# Patient Record
Sex: Male | Born: 1970 | Race: Black or African American | Hispanic: No | Smoking: Current every day smoker
Health system: Southern US, Community
[De-identification: ages and names within clinical notes are randomized; demographics above are authoritative.]

## PROBLEM LIST (undated history)

## (undated) DIAGNOSIS — I10 Essential (primary) hypertension: Secondary | ICD-10-CM

## (undated) DIAGNOSIS — R569 Unspecified convulsions: Secondary | ICD-10-CM

## (undated) DIAGNOSIS — F1010 Alcohol abuse, uncomplicated: Secondary | ICD-10-CM

## (undated) DIAGNOSIS — M109 Gout, unspecified: Secondary | ICD-10-CM

## (undated) HISTORY — PX: HERNIA REPAIR: SHX51

---

## 2017-01-07 ENCOUNTER — Encounter (HOSPITAL_COMMUNITY): Payer: Self-pay

## 2017-01-07 ENCOUNTER — Emergency Department (HOSPITAL_COMMUNITY)
Admission: EM | Admit: 2017-01-07 | Discharge: 2017-01-07 | Discharge status: Against Medical Advice | Payer: Medicaid Other | Attending: Emergency Medicine | Admitting: Emergency Medicine

## 2017-01-07 DIAGNOSIS — R202 Paresthesia of skin: Secondary | ICD-10-CM | POA: Diagnosis present

## 2017-01-07 DIAGNOSIS — Z5321 Procedure and treatment not carried out due to patient leaving prior to being seen by health care provider: Secondary | ICD-10-CM | POA: Insufficient documentation

## 2017-01-07 HISTORY — DX: Unspecified convulsions: R56.9

## 2017-01-07 HISTORY — DX: Gout, unspecified: M10.9

## 2017-01-07 HISTORY — DX: Essential (primary) hypertension: I10

## 2017-01-07 NOTE — ED Triage Notes (Signed)
Pt reports tingling/ numbness that feels like pins and needles in bilateral lower extremities x 2 weeks. Pt states the pain has gotten to the point he is unable to ambulate without severe pain. No neuro deficit.

## 2017-01-07 NOTE — ED Notes (Signed)
Called for vitals recheck, no answer.  

## 2017-04-23 ENCOUNTER — Encounter (HOSPITAL_COMMUNITY): Payer: Self-pay | Admitting: Emergency Medicine

## 2017-04-23 ENCOUNTER — Other Ambulatory Visit: Payer: Self-pay

## 2017-04-23 ENCOUNTER — Emergency Department (HOSPITAL_COMMUNITY): Payer: Medicaid Other

## 2017-04-23 DIAGNOSIS — E872 Acidosis: Secondary | ICD-10-CM | POA: Diagnosis not present

## 2017-04-23 DIAGNOSIS — G621 Alcoholic polyneuropathy: Secondary | ICD-10-CM | POA: Insufficient documentation

## 2017-04-23 DIAGNOSIS — R200 Anesthesia of skin: Secondary | ICD-10-CM | POA: Diagnosis present

## 2017-04-23 DIAGNOSIS — F17200 Nicotine dependence, unspecified, uncomplicated: Secondary | ICD-10-CM | POA: Insufficient documentation

## 2017-04-23 DIAGNOSIS — Z79899 Other long term (current) drug therapy: Secondary | ICD-10-CM | POA: Diagnosis not present

## 2017-04-23 DIAGNOSIS — I10 Essential (primary) hypertension: Secondary | ICD-10-CM | POA: Insufficient documentation

## 2017-04-23 LAB — BASIC METABOLIC PANEL
ANION GAP: 21 — AB (ref 5–15)
BUN: 8 mg/dL (ref 6–20)
CO2: 17 mmol/L — ABNORMAL LOW (ref 22–32)
Calcium: 9.1 mg/dL (ref 8.9–10.3)
Chloride: 100 mmol/L — ABNORMAL LOW (ref 101–111)
Creatinine, Ser: 0.75 mg/dL (ref 0.61–1.24)
Glucose, Bld: 82 mg/dL (ref 65–99)
POTASSIUM: 3.4 mmol/L — AB (ref 3.5–5.1)
SODIUM: 138 mmol/L (ref 135–145)

## 2017-04-23 LAB — CBC
HEMATOCRIT: 36.5 % — AB (ref 39.0–52.0)
HEMOGLOBIN: 12.3 g/dL — AB (ref 13.0–17.0)
MCH: 32.5 pg (ref 26.0–34.0)
MCHC: 33.7 g/dL (ref 30.0–36.0)
MCV: 96.3 fL (ref 78.0–100.0)
Platelets: 238 10*3/uL (ref 150–400)
RBC: 3.79 MIL/uL — AB (ref 4.22–5.81)
RDW: 15.1 % (ref 11.5–15.5)
WBC: 8.3 10*3/uL (ref 4.0–10.5)

## 2017-04-23 LAB — I-STAT TROPONIN, ED: Troponin i, poc: 0 ng/mL (ref 0.00–0.08)

## 2017-04-23 NOTE — ED Triage Notes (Signed)
Pt states that he has numbness in both of his legs from his toes up to his knees.  (reports that has been a chronic issue that is not going away) states that he has sharp chest pain 7/10 and a headache that started this morning. Reports that he recently started drinking again as well.

## 2017-04-24 ENCOUNTER — Emergency Department (HOSPITAL_COMMUNITY)
Admission: EM | Admit: 2017-04-24 | Discharge: 2017-04-24 | Disposition: A | Discharge status: Home / Self Care | Payer: Medicaid Other | Attending: Emergency Medicine | Admitting: Emergency Medicine

## 2017-04-24 ENCOUNTER — Other Ambulatory Visit: Payer: Self-pay

## 2017-04-24 DIAGNOSIS — G621 Alcoholic polyneuropathy: Secondary | ICD-10-CM

## 2017-04-24 DIAGNOSIS — E8729 Other acidosis: Secondary | ICD-10-CM

## 2017-04-24 DIAGNOSIS — E872 Acidosis: Secondary | ICD-10-CM

## 2017-04-24 LAB — HEPATIC FUNCTION PANEL
ALBUMIN: 3.5 g/dL (ref 3.5–5.0)
ALT: 45 U/L (ref 17–63)
AST: 129 U/L — ABNORMAL HIGH (ref 15–41)
Alkaline Phosphatase: 88 U/L (ref 38–126)
BILIRUBIN TOTAL: 1.3 mg/dL — AB (ref 0.3–1.2)
Bilirubin, Direct: 0.3 mg/dL (ref 0.1–0.5)
Indirect Bilirubin: 1 mg/dL — ABNORMAL HIGH (ref 0.3–0.9)
Total Protein: 6.1 g/dL — ABNORMAL LOW (ref 6.5–8.1)

## 2017-04-24 LAB — AMMONIA: Ammonia: 47 umol/L — ABNORMAL HIGH (ref 9–35)

## 2017-04-24 LAB — LIPASE, BLOOD: LIPASE: 31 U/L (ref 11–51)

## 2017-04-24 LAB — MAGNESIUM: Magnesium: 1.3 mg/dL — ABNORMAL LOW (ref 1.7–2.4)

## 2017-04-24 MED ORDER — THIAMINE HCL 100 MG/ML IJ SOLN
100.0000 mg | Freq: Once | INTRAMUSCULAR | Status: AC
  Administered 2017-04-24: 100 mg via INTRAVENOUS
  Filled 2017-04-24: qty 2

## 2017-04-24 MED ORDER — ONDANSETRON HCL 4 MG/2ML IJ SOLN
4.0000 mg | Freq: Once | INTRAMUSCULAR | Status: AC
  Administered 2017-04-24: 4 mg via INTRAVENOUS
  Filled 2017-04-24: qty 2

## 2017-04-24 MED ORDER — MAGNESIUM SULFATE 2 GM/50ML IV SOLN
2.0000 g | Freq: Once | INTRAVENOUS | Status: AC
  Administered 2017-04-24: 2 g via INTRAVENOUS
  Filled 2017-04-24: qty 50

## 2017-04-24 MED ORDER — LORAZEPAM 2 MG/ML IJ SOLN
0.5000 mg | Freq: Once | INTRAMUSCULAR | Status: AC
  Administered 2017-04-24: 0.5 mg via INTRAVENOUS
  Filled 2017-04-24: qty 1

## 2017-04-24 MED ORDER — SODIUM CHLORIDE 0.9 % IV BOLUS (SEPSIS)
1000.0000 mL | Freq: Once | INTRAVENOUS | Status: AC
  Administered 2017-04-24: 1000 mL via INTRAVENOUS

## 2017-04-24 MED ORDER — POTASSIUM CHLORIDE CRYS ER 20 MEQ PO TBCR
40.0000 meq | EXTENDED_RELEASE_TABLET | Freq: Once | ORAL | Status: AC
  Administered 2017-04-24: 40 meq via ORAL
  Filled 2017-04-24: qty 2

## 2017-04-24 MED ORDER — VITAMIN B-1 100 MG PO TABS: 100.0000 mg | ORAL_TABLET | Freq: Once | ORAL | Status: DC

## 2017-04-24 MED ORDER — MAGNESIUM 30 MG PO TABS: 30.0000 mg | ORAL_TABLET | Freq: Two times a day (BID) | ORAL | 0 refills | Status: AC

## 2017-04-24 MED ORDER — FOLIC ACID 1 MG PO TABS
1.0000 mg | ORAL_TABLET | Freq: Once | ORAL | Status: AC
Start: 2017-04-24 — End: 2017-04-24
  Administered 2017-04-24: 1 mg via ORAL
  Filled 2017-04-24: qty 1

## 2017-04-24 NOTE — Discharge Instructions (Signed)
Take multivitamin daily. Stop drinking alcohol. Take magnesium until all gone. Follow up with family doctor for your neuropathy.

## 2017-04-24 NOTE — ED Provider Notes (Signed)
[facility] Provider Note   CSN: [id number] Arrival date & time: 04/23/17  1743     History   Chief Complaint Chief Complaint  Patient presents with  . Numbness  . Chest Pain    HPI Stephen Summers is a 47 y.o. male.  HPI Stephen Summers is a 47 y.o. male with hx of gout, htn, seizures, presents to ED with complaint of bilateral foot numbness up to the knee, abdominal pain, nausea, vomiting, diarrhea, weakness.  Patient states that he has not been able to eat in 3 days.  He states he feels dizzy and dehydrated.  He states he has been seen for the same.,  Patient was just seen for similar complaints 1 week ago and was prescribed Neurontin for peripheral neuropathy.  Patient tells me he quit drinking alcohol 2 months ago, however the note from his family doctor states that he is currently drinking and his last drink was yesterday.  Patient reports generalized abdominal pain, worse in epigastric area that radiates into the chest.  Denies any cough, congestion, chest pain or back pain at this time.  Denies any urinary symptoms.  Denies any blood in his stool or emesis.  Denies any black tarry stools.  Past Medical History:  Diagnosis Date  . Gout   . Hypertension   . Seizures (HCC)     There are no active problems to display for this patient.   Past Surgical History:  Procedure Laterality Date  . HERNIA REPAIR         Home Medications    Prior to Admission medications   Medication Sig Start Date End Date Taking? Authorizing Provider  gabapentin (NEURONTIN) 100 MG capsule Take 100 mg by mouth 3 (three) times daily. 04/16/17  Yes [provider]  potassium chloride SA (K-DUR,KLOR-CON) 20 MEQ tablet Take 20 mEq by mouth daily. 03/29/17  Yes [provider]  ULORIC 40 MG tablet Take 40 mg by mouth daily. 04/14/17  Yes [provider]  Vitamin D, Ergocalciferol, (DRISDOL) 50000 units CAPS capsule Take by mouth once a week.  12/30/16  Yes [provider]    Family History No family history on file.  Social History Social History   Tobacco Use  . Smoking status: Current Every Day Smoker  . Smokeless tobacco: Never Used  Substance Use Topics  . Alcohol use: Yes  . Drug use: No     Allergies   Acetaminophen   Review of Systems Review of Systems  Constitutional: Negative for chills and fever.  Respiratory: Negative for cough, chest tightness and shortness of breath.   Cardiovascular: Negative for chest pain, palpitations and leg swelling.  Gastrointestinal: Positive for abdominal pain, nausea and vomiting. Negative for abdominal distention and diarrhea.  Genitourinary: Negative for dysuria, frequency, hematuria and urgency.  Musculoskeletal: Negative for arthralgias, myalgias, neck pain and neck stiffness.  Skin: Negative for rash.  Allergic/Immunologic: Negative for immunocompromised state.  Neurological: Positive for dizziness, light-headedness and numbness. Negative for weakness and headaches.  All other systems reviewed and are negative.    Physical Exam Updated Vital Signs BP 123/89   Pulse (!) 111   Temp 98.3 F (36.8 C) (Oral)   Resp 18   Ht 6\' 2"  (1.88 m)   Wt 92.5 kg (204 lb)   SpO2 98%   BMI 26.19 kg/m   Physical Exam  Constitutional: He appears well-developed and well-nourished. No distress.  HENT:  Head: Normocephalic and atraumatic.  Eyes: Conjunctivae  are normal.  Neck: Neck supple.  Cardiovascular: Normal rate, regular rhythm and normal heart sounds.  Pulmonary/Chest: Effort normal. No respiratory distress. He has no wheezes. He has no rales.  Abdominal: Soft. Bowel sounds are normal. He exhibits no distension. There is tenderness. There is no rebound.  Epigastric tenderness  Musculoskeletal: He exhibits no edema.  Fungal infection to toenails and toes  Neurological: He is alert.  Tremor noted in upper extremities  Skin: Skin is warm and dry.  Nursing  note and vitals reviewed.    ED Treatments / Results  Labs (all labs ordered are listed, but only abnormal results are displayed) Labs Reviewed  BASIC METABOLIC PANEL - Abnormal; Notable for the following components:      Result Value   Potassium 3.4 (*)    Chloride 100 (*)    CO2 17 (*)    Anion gap 21 (*)    All other components within normal limits  CBC - Abnormal; Notable for the following components:   RBC 3.79 (*)    Hemoglobin 12.3 (*)    HCT 36.5 (*)    All other components within normal limits  HEPATIC FUNCTION PANEL - Abnormal; Notable for the following components:   Total Protein 6.1 (*)    AST 129 (*)    Total Bilirubin 1.3 (*)    Indirect Bilirubin 1.0 (*)    All other components within normal limits  AMMONIA - Abnormal; Notable for the following components:   Ammonia 47 (*)    All other components within normal limits  MAGNESIUM - Abnormal; Notable for the following components:   Magnesium 1.3 (*)    All other components within normal limits  LIPASE, BLOOD  I-STAT TROPONIN, ED    EKG  EKG Interpretation  Date/Time:  Wednesday April 23 2017 18:10:51 EST Ventricular Rate:  119 PR Interval:  168 QRS Duration: 82 QT Interval:  314 QTC Calculation: 441 R Axis:   81 Text Interpretation:  Sinus tachycardia Possible Left atrial enlargement Cannot rule out Anterior infarct , age undetermined Abnormal ECG Confirmed by Tilden Fossa [phone]) on 04/24/2017 7:17:12 AM       Radiology Dg Chest 2 View  Result Date: 04/23/2017 CLINICAL DATA:  Numbness in both legs from toes knees, headache, sharp chest pain EXAM: CHEST  2 VIEW COMPARISON:  None FINDINGS: Normal heart size, mediastinal contours, and pulmonary vascularity. Mild peribronchial thickening. No pulmonary infiltrate, pleural effusion, or pneumothorax. Bones unremarkable. IMPRESSION: Mild bronchitic changes without infiltrate. Electronically Signed   By: Ulyses Southward M.D.   On: 04/23/2017 19:09     Procedures Procedures (including critical care time)  Medications Ordered in ED Medications  sodium chloride 0.9 % bolus 1,000 mL (not administered)  ondansetron (ZOFRAN) injection 4 mg (not administered)  LORazepam (ATIVAN) injection 0.5 mg (not administered)     Initial Impression / Assessment and Plan / ED Course  I have reviewed the triage vital signs and the nursing notes.  Pertinent labs & imaging results that were available during my care of the patient were reviewed by me and considered in my medical decision making (see chart for details).     Pt seen and examined. Pt in ED with numbness to bilateral legs, it does not seem to be a new findings. Pt was just seen for the same, prescribed neurontin. Pt told me he does not drink alcohol, but recent note from a week ago from PCP states that pt is an active drinker.  She has  been in the waiting room for over 12 hours, on exam he is tremulous.  I will given Ativan cardiac, will give IV fluids.  Bicarb here 17, anion gap is 2100.  Suspect alcoholic acidosis.  Will hydrate, check LFTs, check ammonia level, monitor.   10:22 AM Patient reassessed, he is feeling better, his heart rate improved.  His heart rate is now in the 80s.  Blood pressure improved.  Magnesium is 1.3, will replace with IV magnesium and p.o. potassium.  Discussed results with patient.  Plan to medicate, ambulate, still pending UA.  Patient ambulated with no problems.  Vital signs continued to stabilize.  Plan to discharge home with some magnesium.  Follow-up with family doctor.  Continue Neurontin for peripheral neuropathy.   Vitals:   04/24/17 1045 04/24/17 1100 04/24/17 1115 04/24/17 1130  BP: (!) 166/107 (!) 151/108 (!) 144/98 (!) 156/103  Pulse: 65 86 81 79  Resp: (!) 9 16 12 11   Temp:      TempSrc:      SpO2: 99% 94% 97% 100%  Weight:      Height:         Final Clinical Impressions(s) / ED Diagnoses   Final diagnoses:  Alcoholic peripheral  neuropathy (HCC)  Alcoholic ketoacidosis  Hypomagnesemia    ED Discharge Orders        Ordered    magnesium 30 MG tablet  2 times daily     04/24/17 [address] [place], PA-C 04/24/17 1613    Lorre Nick, MD 04/25/17 1116

## 2017-08-24 ENCOUNTER — Emergency Department (HOSPITAL_COMMUNITY)
Admission: EM | Admit: 2017-08-24 | Discharge: 2017-08-26 | Disposition: A | Discharge status: Home / Self Care | Payer: Medicaid Other | Attending: Emergency Medicine | Admitting: Emergency Medicine

## 2017-08-24 ENCOUNTER — Encounter (HOSPITAL_COMMUNITY): Payer: Self-pay | Admitting: Emergency Medicine

## 2017-08-24 DIAGNOSIS — M7989 Other specified soft tissue disorders: Secondary | ICD-10-CM

## 2017-08-24 DIAGNOSIS — F332 Major depressive disorder, recurrent severe without psychotic features: Secondary | ICD-10-CM | POA: Diagnosis not present

## 2017-08-24 DIAGNOSIS — F1020 Alcohol dependence, uncomplicated: Secondary | ICD-10-CM

## 2017-08-24 DIAGNOSIS — R000 Tachycardia, unspecified: Secondary | ICD-10-CM | POA: Diagnosis not present

## 2017-08-24 DIAGNOSIS — Z79899 Other long term (current) drug therapy: Secondary | ICD-10-CM | POA: Insufficient documentation

## 2017-08-24 DIAGNOSIS — F17200 Nicotine dependence, unspecified, uncomplicated: Secondary | ICD-10-CM | POA: Diagnosis not present

## 2017-08-24 DIAGNOSIS — R2240 Localized swelling, mass and lump, unspecified lower limb: Secondary | ICD-10-CM | POA: Diagnosis present

## 2017-08-24 DIAGNOSIS — I10 Essential (primary) hypertension: Secondary | ICD-10-CM | POA: Insufficient documentation

## 2017-08-24 DIAGNOSIS — F10120 Alcohol abuse with intoxication, uncomplicated: Secondary | ICD-10-CM | POA: Insufficient documentation

## 2017-08-24 DIAGNOSIS — F10920 Alcohol use, unspecified with intoxication, uncomplicated: Secondary | ICD-10-CM

## 2017-08-24 LAB — COMPREHENSIVE METABOLIC PANEL
ALK PHOS: 69 U/L (ref 38–126)
ALT: 40 U/L (ref 17–63)
ANION GAP: 21 — AB (ref 5–15)
AST: 93 U/L — ABNORMAL HIGH (ref 15–41)
Albumin: 4.2 g/dL (ref 3.5–5.0)
BUN: 12 mg/dL (ref 6–20)
CALCIUM: 9 mg/dL (ref 8.9–10.3)
CO2: 18 mmol/L — ABNORMAL LOW (ref 22–32)
CREATININE: 0.73 mg/dL (ref 0.61–1.24)
Chloride: 97 mmol/L — ABNORMAL LOW (ref 101–111)
Glucose, Bld: 67 mg/dL (ref 65–99)
Potassium: 4.4 mmol/L (ref 3.5–5.1)
Sodium: 136 mmol/L (ref 135–145)
Total Bilirubin: 0.9 mg/dL (ref 0.3–1.2)
Total Protein: 8 g/dL (ref 6.5–8.1)

## 2017-08-24 LAB — RAPID URINE DRUG SCREEN, HOSP PERFORMED
Amphetamines: NOT DETECTED
BARBITURATES: NOT DETECTED
BENZODIAZEPINES: NOT DETECTED
Cocaine: NOT DETECTED
Opiates: NOT DETECTED
Tetrahydrocannabinol: NOT DETECTED

## 2017-08-24 LAB — ETHANOL: Alcohol, Ethyl (B): 316 mg/dL (ref <10)

## 2017-08-24 LAB — CBC
HEMATOCRIT: 36.2 % — AB (ref 39.0–52.0)
Hemoglobin: 11.7 g/dL — ABNORMAL LOW (ref 13.0–17.0)
MCH: 29.5 pg (ref 26.0–34.0)
MCHC: 32.3 g/dL (ref 30.0–36.0)
MCV: 91.4 fL (ref 78.0–100.0)
PLATELETS: 352 10*3/uL (ref 150–400)
RBC: 3.96 MIL/uL — ABNORMAL LOW (ref 4.22–5.81)
RDW: 12.8 % (ref 11.5–15.5)
WBC: 6.9 10*3/uL (ref 4.0–10.5)

## 2017-08-24 MED ORDER — SODIUM CHLORIDE 0.9 % IV BOLUS
1000.0000 mL | Freq: Once | INTRAVENOUS | Status: AC
  Administered 2017-08-24: 1000 mL via INTRAVENOUS

## 2017-08-24 MED ORDER — FOLIC ACID 1 MG PO TABS
1.0000 mg | ORAL_TABLET | Freq: Once | ORAL | Status: AC
  Administered 2017-08-24: 1 mg via ORAL
  Filled 2017-08-24: qty 1

## 2017-08-24 MED ORDER — VITAMIN B-1 100 MG PO TABS
100.0000 mg | ORAL_TABLET | Freq: Once | ORAL | Status: AC
  Administered 2017-08-24: 100 mg via ORAL
  Filled 2017-08-24: qty 1

## 2017-08-24 MED ORDER — CHLORDIAZEPOXIDE HCL 25 MG PO CAPS: ORAL_CAPSULE | ORAL | 0 refills | Status: DC

## 2017-08-24 NOTE — ED Triage Notes (Signed)
Pt presents to ED for "a while" of bilateral lower limb swelling, worse when walking on them.  Pt also states he would like to detox from alcohol.  Last drink a few hours ago.  Patient alreqdy shady, hypertensive.

## 2017-08-24 NOTE — ED Provider Notes (Signed)
[facility] EMERGENCY DEPARTMENT Provider Note   CSN: [id number] Arrival date & time: 08/24/17  2001     History   Chief Complaint Chief Complaint  Patient presents with  . Leg Swelling  . Alcohol Problem    HPI Stephen Summers is a 47 y.o. male.  HPI Patient presents with concern of bilateral lower extremity swelling, pain with ambulation. Patient notes a history of hypertension, for which she has not taken his medication in about 2 weeks, as well as alcohol abuse. He notes that he has been drinking substantially, particularly the past 4 days, and within the past few hours. He drinks either an innumerable amount of beer or 2 pints of hard alcohol on a daily basis, he estimates. He acknowledges pain essentially everywhere, but seemingly focuses on pain in both lower extremities, with swelling in these areas. Onset is unclear, and symptoms are again, worse with ambulation. No loss of sensation in the distal extremities. Patient acknowledges some dysphoria, but denies suicidal ideation, homicidal ideation. Patient has children in the area, but was recently kicked out of his girlfriend's house, and is transient.   Past Medical History:  Diagnosis Date  . Gout   . Hypertension   . Seizures (HCC)     There are no active problems to display for this patient.   Past Surgical History:  Procedure Laterality Date  . HERNIA REPAIR          Home Medications    Prior to Admission medications   Medication Sig Start Date End Date Taking? Authorizing Provider  chlordiazePOXIDE (LIBRIUM) 25 MG capsule 50mg  PO TID x 1D, then 25-50mg  PO BID X 1D, then 25-50mg  PO QD X 1D 08/24/17   Gerhard Munch, MD  gabapentin (NEURONTIN) 100 MG capsule Take 100 mg by mouth 3 (three) times daily. 04/16/17   [provider]  magnesium 30 MG tablet Take 1 tablet (30 mg total) by mouth 2 (two) times daily. 04/24/17   Kirichenko, Tatyana, PA-C  potassium chloride SA  (K-DUR,KLOR-CON) 20 MEQ tablet Take 20 mEq by mouth daily. 03/29/17   [provider]  ULORIC 40 MG tablet Take 40 mg by mouth daily. 04/14/17   [provider]  Vitamin D, Ergocalciferol, (DRISDOL) 50000 units CAPS capsule Take by mouth once a week. 12/30/16   [provider]    Family History History reviewed. No pertinent family history.  Social History Social History   Tobacco Use  . Smoking status: Current Every Day Smoker  . Smokeless tobacco: Never Used  Substance Use Topics  . Alcohol use: Yes  . Drug use: No     Allergies   Acetaminophen   Review of Systems Review of Systems  Constitutional:       Per HPI, otherwise negative  HENT:       Per HPI, otherwise negative  Respiratory:       Per HPI, otherwise negative  Cardiovascular:       Per HPI, otherwise negative  Gastrointestinal: Negative for vomiting.  Endocrine:       Negative aside from HPI  Genitourinary:       Neg aside from HPI   Musculoskeletal:       Per HPI, otherwise negative  Skin: Negative.   Neurological: Negative for syncope.     Physical Exam Updated Vital Signs BP (!) 159/88   Pulse (!) 109   Temp 97.9 F (36.6 C) (Oral)   Resp 12   SpO2 100%  Physical Exam  Constitutional: He is oriented to person, place, and time. He appears well-developed.  Disheveled male awake and alert  HENT:  Head: Normocephalic and atraumatic.  Eyes: Conjunctivae and EOM are normal.  Cardiovascular: Regular rhythm.  Tachycardic  Pulmonary/Chest: Effort normal. No stridor. No respiratory distress.  Abdominal: He exhibits no distension.  Musculoskeletal: He exhibits edema.  Bilateral lower extremity edema, symmetric, nonpitting  Neurological: He is alert and oriented to person, place, and time. He displays atrophy.  Skin: Skin is warm and dry.  Psychiatric: His mood appears anxious. He exhibits a depressed mood.  Nursing note and vitals reviewed.    ED Treatments /  Results  Labs (all labs ordered are listed, but only abnormal results are displayed) Labs Reviewed  COMPREHENSIVE METABOLIC PANEL - Abnormal; Notable for the following components:      Result Value   Chloride 97 (*)    CO2 18 (*)    AST 93 (*)    Anion gap 21 (*)    All other components within normal limits  ETHANOL - Abnormal; Notable for the following components:   Alcohol, Ethyl (B) 316 (*)    All other components within normal limits  CBC - Abnormal; Notable for the following components:   RBC 3.96 (*)    Hemoglobin 11.7 (*)    HCT 36.2 (*)    All other components within normal limits  RAPID URINE DRUG SCREEN, HOSP PERFORMED    EKG None  Radiology No results found.  Procedures Procedures (including critical care time)  Medications Ordered in ED Medications  sodium chloride 0.9 % bolus 1,000 mL (has no administration in time range)  sodium chloride 0.9 % bolus 1,000 mL (0 mLs Intravenous Stopped 08/24/17 2319)  thiamine (VITAMIN B-1) tablet 100 mg (100 mg Oral Given 08/24/17 2139)  folic acid (FOLVITE) tablet 1 mg (1 mg Oral Given 08/24/17 2202)     Initial Impression / Assessment and Plan / ED Course  I have reviewed the triage vital signs and the nursing notes.  Pertinent labs & imaging results that were available during my care of the patient were reviewed by me and considered in my medical decision making (see chart for details).     11:19 PM Patient remains intoxicated, tachycardic, but more appropriately oriented. With substantial alcohol intoxication, he will require additional time for sobriety. Patient is also receiving additional fluids. Patient has no evidence for complicated withdrawal, no evidence for concurrent physiologic disease, including coronary ischemia, or other pathology. When the patient is awake, sober, he can be discharged, and will be provided resources for outpatient counseling/substance abuse center, as well as Chlordiapoxide taper  Final  Clinical Impressions(s) / ED Diagnoses   Final diagnoses:  Alcoholic intoxication without complication (HCC)  Tachycardia    ED Discharge Orders        Ordered    chlordiazePOXIDE (LIBRIUM) 25 MG capsule     08/24/17 2319       Gerhard MunchLockwood, Kemberly Taves, MD 08/24/17 2322

## 2017-08-24 NOTE — ED Notes (Signed)
Pt unable to void at this time. 

## 2017-08-24 NOTE — Discharge Instructions (Addendum)
Please be sure to proceed directly to a rehabilitation center.   Take librium as prescribed.   Take hctz as prescribed.   Increase metoprolol 50 mg daily.   Return to ER if you have chest pain, trouble breathing, tremors, seizures.

## 2017-08-25 ENCOUNTER — Other Ambulatory Visit: Payer: Self-pay

## 2017-08-25 DIAGNOSIS — F17200 Nicotine dependence, unspecified, uncomplicated: Secondary | ICD-10-CM

## 2017-08-25 DIAGNOSIS — Z653 Problems related to other legal circumstances: Secondary | ICD-10-CM

## 2017-08-25 DIAGNOSIS — R454 Irritability and anger: Secondary | ICD-10-CM | POA: Diagnosis not present

## 2017-08-25 DIAGNOSIS — F332 Major depressive disorder, recurrent severe without psychotic features: Secondary | ICD-10-CM

## 2017-08-25 DIAGNOSIS — Y908 Blood alcohol level of 240 mg/100 ml or more: Secondary | ICD-10-CM | POA: Diagnosis not present

## 2017-08-25 DIAGNOSIS — F10229 Alcohol dependence with intoxication, unspecified: Secondary | ICD-10-CM

## 2017-08-25 DIAGNOSIS — R200 Anesthesia of skin: Secondary | ICD-10-CM

## 2017-08-25 DIAGNOSIS — F419 Anxiety disorder, unspecified: Secondary | ICD-10-CM

## 2017-08-25 DIAGNOSIS — R450 Nervousness: Secondary | ICD-10-CM | POA: Diagnosis not present

## 2017-08-25 DIAGNOSIS — F10920 Alcohol use, unspecified with intoxication, uncomplicated: Secondary | ICD-10-CM | POA: Insufficient documentation

## 2017-08-25 DIAGNOSIS — Z9114 Patient's other noncompliance with medication regimen: Secondary | ICD-10-CM

## 2017-08-25 DIAGNOSIS — F1020 Alcohol dependence, uncomplicated: Secondary | ICD-10-CM

## 2017-08-25 MED ORDER — IBUPROFEN 400 MG PO TABS
600.0000 mg | ORAL_TABLET | Freq: Once | ORAL | Status: AC
  Administered 2017-08-25: 600 mg via ORAL
  Filled 2017-08-25: qty 1

## 2017-08-25 MED ORDER — LORAZEPAM 1 MG PO TABS
0.0000 mg | ORAL_TABLET | Freq: Four times a day (QID) | ORAL | Status: DC
  Administered 2017-08-25: 2 mg via ORAL
  Administered 2017-08-25: 1 mg via ORAL
  Administered 2017-08-26: 2 mg via ORAL
  Filled 2017-08-25 (×2): qty 2
  Filled 2017-08-25: qty 1

## 2017-08-25 MED ORDER — LORAZEPAM 1 MG PO TABS: 0.0000 mg | ORAL_TABLET | Freq: Two times a day (BID) | ORAL | Status: DC

## 2017-08-25 MED ORDER — IBUPROFEN 800 MG PO TABS
800.0000 mg | ORAL_TABLET | Freq: Once | ORAL | Status: AC
  Administered 2017-08-25: 800 mg via ORAL
  Filled 2017-08-25: qty 1

## 2017-08-25 MED ORDER — CLONIDINE HCL 0.2 MG PO TABS
0.2000 mg | ORAL_TABLET | Freq: Once | ORAL | Status: AC
  Administered 2017-08-25: 0.2 mg via ORAL
  Filled 2017-08-25: qty 1

## 2017-08-25 MED ORDER — LORAZEPAM 2 MG/ML IJ SOLN
0.0000 mg | Freq: Four times a day (QID) | INTRAMUSCULAR | Status: DC
  Administered 2017-08-25: 2 mg via INTRAVENOUS
  Filled 2017-08-25: qty 1

## 2017-08-25 MED ORDER — LORAZEPAM 2 MG/ML IJ SOLN: 0.0000 mg | Freq: Two times a day (BID) | INTRAMUSCULAR | Status: DC

## 2017-08-25 MED ORDER — METOPROLOL TARTRATE 25 MG PO TABS: 37.5000 mg | ORAL_TABLET | Freq: Every day | ORAL | Status: DC

## 2017-08-25 MED ORDER — VITAMIN B-1 100 MG PO TABS
100.0000 mg | ORAL_TABLET | Freq: Every day | ORAL | Status: DC
  Administered 2017-08-25 – 2017-08-26 (×2): 100 mg via ORAL
  Filled 2017-08-25 (×2): qty 1

## 2017-08-25 MED ORDER — GABAPENTIN 100 MG PO CAPS
100.0000 mg | ORAL_CAPSULE | Freq: Three times a day (TID) | ORAL | Status: DC
  Administered 2017-08-25 – 2017-08-26 (×2): 100 mg via ORAL
  Filled 2017-08-25 (×2): qty 1

## 2017-08-25 MED ORDER — CLONIDINE HCL 0.2 MG PO TABS
0.1000 mg | ORAL_TABLET | Freq: Once | ORAL | Status: AC
  Administered 2017-08-25: 0.1 mg via ORAL
  Filled 2017-08-25: qty 1

## 2017-08-25 MED ORDER — VITAMIN B-1 100 MG PO TABS: 100.0000 mg | ORAL_TABLET | Freq: Every day | ORAL | Status: DC

## 2017-08-25 MED ORDER — THIAMINE HCL 100 MG/ML IJ SOLN: 100.0000 mg | Freq: Every day | INTRAMUSCULAR | Status: DC

## 2017-08-25 MED ORDER — PANTOPRAZOLE SODIUM 40 MG PO TBEC
40.0000 mg | DELAYED_RELEASE_TABLET | Freq: Every day | ORAL | Status: DC
  Administered 2017-08-26: 40 mg via ORAL
  Filled 2017-08-25: qty 1

## 2017-08-25 NOTE — ED Triage Notes (Signed)
Reported to EDP Cook Pt BP. New orders given

## 2017-08-25 NOTE — ED Notes (Signed)
[facility] pt is to see psychiatry this am. Pt sleeping. No distress noted. Resp even and non-labored.  Will continue to monitor.

## 2017-08-25 NOTE — BH Assessment (Addendum)
Tele Assessment Note   Patient Name: Stephen Summers MRN: [id number] Referring Physician: Benjiman Core, MD Location of Patient: [facility], D33C Location of Provider: Behavioral Health TTS Department  Stephen Summers is an 47 y.o. widowed male who presents unaccompanied to [facility] ED requesting treatment for alcohol dependence. Pt reports he has been drinking 2-3 bottles of liquor daily for years. Pt's blood alcohol level is 316. He says he has been unable to stop and his longest period of sobriety is one week. He says he experiences withdrawal symptoms when he tries to stop including tremors, sweats, nausea, vomiting and decreased eating. He acknowledges experiencing blackouts. He says he is on disability due to a seizure disorder. Pt reports his mood recently has been irritable. Pt acknowledges symptoms including crying spells, social withdrawal, loss of interest in usual pleasures, fatigue, irritability, decreased concentration, decreased sleep, decreased appetite and feelings of guilt and hopelessness. He says he had suicidal ideation earlier today with no plan or intent and he denies current suicidal ideation. He denies any history of suicide attempts. He denies any history of intentional self-injurious behaviors. Pt reports he experiences hallucinations of feel like someone is standing beside him. He also reports hearing random noise of bells ringing. He says he sometimes sees "yellow flashes." Pt denies use of substances other than alcohol and Pt's urine drug screen is negative. Pt also reports several somatic complaints including: numbness in shoulder, numbness is feet and toes, severe headache and chest discomfort. He denies homicidal ideation or history of violence.  Pt identifies his somatic complaints and consequences of alcohol use as his primary stressors. He says his girlfriend of five years kicked him out of her residence one week ago due to Pt's drinking. He says he is currently  staying with his adult daughter and he also has three adult sons who live locally. Pt says he has been charged with DWI and has a court date in August. He says he is on disability due to seizure disorder. Pt says his wife died of cancer three years ago. He denies history of abuse or trauma. Pt denies any history of inpatient or outpatient mental health or substance abuse treatment.  Pt is dressed in hospital scrubs, alert, intoxicated and oriented x4. Pt speaks in a clear tone, at moderate volume and normal pace. Motor behavior appears normal. Eye contact is good. Pt's mood is depressed and affect is congruent with mood. Thought process is coherent and relevant. There is no obvious indication Pt is currently responding to internal stimuli or experiencing delusional thought content. Pt was cooperative throughout assessment. He says he is motivated to stop drinking and is willing to sign voluntarily into a treatment facility.   Diagnosis: F10.20 Alcohol Use Disorder, Severe  Past Medical History:  Past Medical History:  Diagnosis Date  . Gout   . Hypertension   . Seizures (HCC)     Past Surgical History:  Procedure Laterality Date  . HERNIA REPAIR      Family History: History reviewed. No pertinent family history.  Social History:  reports that he has been smoking.  He has never used smokeless tobacco. He reports that he drinks alcohol. He reports that he does not use drugs.  Additional Social History:  Alcohol / Drug Use Pain Medications: See MAR Prescriptions: See MAR Over the Counter: See MAR History of alcohol / drug use?: Yes Longest period of sobriety (when/how long): One week Negative Consequences of Use: Financial, Legal, Personal relationships Withdrawal Symptoms: Irritability,  Nausea / Vomiting, Tremors, Sweats, Seizures Onset of Seizures: Late 4620s Date of most recent seizure: Pt doesn't know Substance #1 Name of Substance 1: Alcohol 1 - Age of First Use: 12 1 - Amount  (size/oz): 2-3 fifths of liquor 1 - Frequency: Daily 1 - Duration: Ongoing for years 1 - Last Use / Amount: 08/24/17, Pt cannot estimate how much he drank  CIWA: CIWA-Ar BP: (!) 150/90 Pulse Rate: (!) 103 Nausea and Vomiting: no nausea and no vomiting Tactile Disturbances: none Tremor: moderate, with patient's arms extended Auditory Disturbances: moderate harshness or ability to frighten Paroxysmal Sweats: no sweat visible Visual Disturbances: not present Anxiety: mildly anxious Headache, Fullness in Head: moderately severe Agitation: normal activity Orientation and Clouding of Sensorium: oriented and can do serial additions CIWA-Ar Total: 12 COWS:    Allergies:  Allergies  Allergen Reactions  . Acetaminophen Other (See Comments)    Due to liver.    Home Medications:  (Not in a hospital admission)  OB/GYN Status:  No LMP for male patient.  General Assessment Data [facility] Assessment: In system Is this a Tele or Face-to-Face Assessment?: Tele Assessment Is this an Initial Assessment or a Re-assessment for this encounter?: Initial Assessment Marital status: Widowed BancroftMaiden name: NA Is patient pregnant?: No Pregnancy Status: No Living Arrangements: Other relatives(Staying with daughter) Can pt return to current living arrangement?: Yes Admission Status: Voluntary Is patient capable of signing voluntary admission?: Yes Referral Source: Self/Family/Friend Insurance type: Medicaid     Crisis Care Plan Living Arrangements: Other relatives(Staying with daughter) Legal Guardian: Other:(Self) Name of Psychiatrist: None Name of Therapist: None  Education Status Is patient currently in school?: No Is the patient employed, unemployed or receiving disability?: Receiving disability income  Risk to self with the past 6 months Suicidal Ideation: No Has patient been a risk to self within the past 6 months prior to admission? : Yes Suicidal Intent:  No-Not Currently/Within Last 6 Months Has patient had any suicidal intent within the past 6 months prior to admission? : No Is patient at risk for suicide?: No Suicidal Plan?: No Has patient had any suicidal plan within the past 6 months prior to admission? : No Access to Means: No What has been your use of drugs/alcohol within the last 12 months?: Pt drinking large quantities of alcohol daily Previous Attempts/Gestures: No How many times?: 0 Other Self Harm Risks: None Triggers for Past Attempts: None known Intentional Self Injurious Behavior: None Family Suicide History: No Recent stressful life event(s): Loss (Comment)(Girlfriend broke up with him) Persecutory voices/beliefs?: No Depression: Yes Depression Symptoms: Despondent, Insomnia, Tearfulness, Isolating, Fatigue, Guilt, Loss of interest in usual pleasures, Feeling worthless/self pity, Feeling angry/irritable Substance abuse history and/or treatment for substance abuse?: No Suicide prevention information given to non-admitted patients: Not applicable  Risk to Others within the past 6 months Homicidal Ideation: No Does patient have any lifetime risk of violence toward others beyond the six months prior to admission? : No Thoughts of Harm to Others: No Current Homicidal Intent: No Current Homicidal Plan: No Access to Homicidal Means: No Identified Victim: None History of harm to others?: No Assessment of Violence: None Noted Violent Behavior Description: Pt denies history of violence Does patient have access to weapons?: No Criminal Charges Pending?: Yes Describe Pending Criminal Charges: DWI Does patient have a court date: Yes Court Date: 10/16/17 Is patient on probation?: No  Psychosis Hallucinations: Auditory, Visual Delusions: None noted  Mental Status Report Appearance/Hygiene: In scrubs  Eye Contact: Good Motor Activity: Unremarkable Speech: Logical/coherent Level of Consciousness: Alert, Other  (Comment)(Intoxicated) Mood: Depressed Affect: Appropriate to circumstance Anxiety Level: Minimal Thought Processes: Coherent, Relevant Judgement: Partial Orientation: Person, Place, Time, Situation Obsessive Compulsive Thoughts/Behaviors: None  Cognitive Functioning Concentration: Decreased Memory: Recent Intact, Remote Intact Is patient IDD: No Is patient DD?: No Insight: Fair Impulse Control: Fair Appetite: Poor Have you had any weight changes? : Loss Amount of the weight change? (lbs): 5 lbs Sleep: Decreased Total Hours of Sleep: 4 Vegetative Symptoms: None  ADLScreening [facility] Assessment Services) Patient's cognitive ability adequate to safely complete daily activities?: Yes Patient able to express need for assistance with ADLs?: Yes Independently performs ADLs?: Yes (appropriate for developmental age)  Prior Inpatient Therapy Prior Inpatient Therapy: No  Prior Outpatient Therapy Prior Outpatient Therapy: No Does patient have an ACCT team?: No Does patient have Intensive In-House Services?  : No Does patient have [facility] services? : No Does patient have P4CC services?: No  ADL Screening (condition at time of admission) Patient's cognitive ability adequate to safely complete daily activities?: Yes Is the patient deaf or have difficulty hearing?: No Does the patient have difficulty seeing, even when wearing glasses/contacts?: No Does the patient have difficulty concentrating, remembering, or making decisions?: No Patient able to express need for assistance with ADLs?: Yes Does the patient have difficulty dressing or bathing?: No Independently performs ADLs?: Yes (appropriate for developmental age) Does the patient have difficulty walking or climbing stairs?: No Weakness of Legs: None Weakness of Arms/Hands: None  Home Assistive Devices/Equipment Home Assistive Devices/Equipment: None    Abuse/Neglect Assessment (Assessment to be complete while patient is  alone) Abuse/Neglect Assessment Can Be Completed: Yes Physical Abuse: Denies Verbal Abuse: Denies Sexual Abuse: Denies Exploitation of patient/patient's resources: Denies Self-Neglect: Denies     Merchant navy officer (For Healthcare) Does Patient Have a Medical Advance Directive?: No Would patient like information on creating a medical advance directive?: No - Patient declined          Disposition: Gave clinical report to Donell Sievert, PA who recommended that due to intoxication Pt be observed for safety and stabilization and evaluated by psychiatry later today. Notified Dr. Benjiman Core and Madalyn Rob, RN of recommendation.  Disposition Initial Assessment Completed for this Encounter: Yes Patient referred to: Other (Comment)  This service was provided via telemedicine using a 2-way, interactive audio and video technology.  Names of all persons participating in this telemedicine service and their role in this encounter. Name: Charmian Muff Role: Patient  Name: Shela Commons, [facility] Role: TTS counselor         Harlin Rain Patsy [place], [facility], [facility], [facility] Triage Specialist [phone]  [place], [place] 08/25/2017 3:19 AM

## 2017-08-25 NOTE — Consult Note (Signed)
Telepsych Consultation   Reason for Consult:  Depressive symptoms, ongoing alcohol abuse Referring Physician: EDP Location of [facility]: [facility] Location of Provider: [place] Department  Patient Identification: Stephen Summers MRN:  [id number] Principal Diagnosis: Alcohol dependence ([place]) Diagnosis:   Patient Active Problem List   Diagnosis Date Noted  . MDD (major depressive disorder), recurrent episode, severe ([place]) [F33.2] 08/25/2017  . Alcohol dependence ([place]) [F10.20] 08/25/2017  . Alcoholic intoxication without complication ([place]) [Q67.619]     Total Time spent with patient: 20 minutes  Subjective:   Stephen Summers is a 47 y.o. male patient who stated he was in the ED because "my drinking problem, my leg and chest are numb". Stated he doesn't feel like he has control over his alcohol use and worried for his life as he knows the alcoholism could eventually lead to his death.  HPI:  Stephen Summers is an 47 y.o. widowed male who presented unaccompanied [facility] requesting treatment for alcohol dependence. Patient reported he had drinking about 1/2 gallon of gin every 2 days for the past 5 days without eating. Stated his alcohol use in the past 8-9 months had been worsening whereby he could drink up to 1/2 gallon of gin/day.  Pt reports he has been drinking 2-3 bottles of liquor daily for years. He expresses frustration with his life overall especially the progression of the alcohol abuse.  Pt's blood alcohol level is 316. He says he has been unable to stop and his longest period of sobriety is one week. He says he experiences withdrawal symptoms when he tries to stop including tremors, sweats, nausea, vomiting and decreased eating. He acknowledges experiencing blackouts. He says he is on disability due to a seizure disorder and concerned about the effects of the alcohol abuse on seizures. Pt admits history of depression and stated he was started on an antidepressant "a  couple of months ago" but he self d/ced this for no reason. Pt reports his mood recently has been irritable. Pt acknowledges symptoms including crying spells, social withdrawal, loss of interest in usual pleasures, fatigue, irritability, decreased concentration, decreased sleep, decreased appetite and feelings of guilt and hopelessness. He says he had suicidal ideation  with no plan or intent and he denies current suicidal ideation. He denies any history of suicide attempts. He denies any history of intentional self-injurious behaviors. Pt reported one episode of hallucination last night whereby he felt like someone was standing beside him.Pt denies use of substances other than alcohol with most recent level 316 and Pt's urine drug screen is negative. Pt also reports several somatic complaints including: numbness in shoulder, numbness is feet and toes, severe headache and chest discomfort. He denies homicidal ideation or history of violence  Past Psychiatric History: Depression, Alcohol abuse  Risk to Self: Suicidal Ideation: No Suicidal Intent: No-Not Currently/Within Last 6 Months Is patient at risk for suicide?: No Suicidal Plan?: No Access to Means: No What has been your use of drugs/alcohol within the last 12 months?: Pt drinking large quantities of alcohol daily How many times?: 0 Other Self Harm Risks: None Triggers for Past Attempts: None known Intentional Self Injurious Behavior: None Risk to Others: Homicidal Ideation: No Thoughts of Harm to Others: No Current Homicidal Intent: No Current Homicidal Plan: No Access to Homicidal Means: No Identified Victim: None History of harm to others?: No Assessment of Violence: None Noted Violent Behavior Description: Pt denies history of violence Does patient have access to weapons?: No Criminal  Charges Pending?: Yes Describe Pending Criminal Charges: DWI Does patient have a court date: Yes Court Date: 10/16/17 Prior Inpatient Therapy: Prior  Inpatient Therapy: No Prior Outpatient Therapy: Prior Outpatient Therapy: No Does patient have an ACCT team?: No Does patient have Intensive In-House Services?  : No Does patient have [facility] services? : No Does patient have P4CC services?: No  Past Medical History:  Past Medical History:  Diagnosis Date  . Gout   . Hypertension   . Seizures ([place])     Past Surgical History:  Procedure Laterality Date  . HERNIA REPAIR     Family History: History reviewed. No pertinent family history. Family Psychiatric  History: Denies Social History:  Social History   Substance and Sexual Activity  Alcohol Use Yes     Social History   Substance and Sexual Activity  Drug Use No    Social History   Socioeconomic History  . Marital status: Widowed    Spouse name: Not on file  . Number of children: Not on file  . Years of education: Not on file  . Highest education level: Not on file  Occupational History  . Not on file  Social Needs  . Financial resource strain: Not on file  . Food insecurity:    Worry: Not on file    Inability: Not on file  . Transportation needs:    Medical: Not on file    Non-medical: Not on file  Tobacco Use  . Smoking status: Current Every Day Smoker  . Smokeless tobacco: Never Used  Substance and Sexual Activity  . Alcohol use: Yes  . Drug use: No  . Sexual activity: Not on file  Lifestyle  . Physical activity:    Days per week: Not on file    Minutes per session: Not on file  . Stress: Not on file  Relationships  . Social connections:    Talks on phone: Not on file    Gets together: Not on file    Attends religious service: Not on file    Active member of club or organization: Not on file    Attends meetings of clubs or organizations: Not on file    Relationship status: Not on file  Other Topics Concern  . Not on file  Social History Narrative  . Not on file   Additional Social History:    Allergies:   Allergies  Allergen Reactions   . Acetaminophen Other (See Comments)    Due to liver.    Labs:  Results for orders placed or performed during the hospital encounter of 08/24/17 (from the past 48 hour(s))  Comprehensive metabolic panel     Status: Abnormal   Collection Time: 08/24/17  8:25 PM  Result Value Ref Range   Sodium 136 135 - 145 mmol/L   Potassium 4.4 3.5 - 5.1 mmol/L   Chloride 97 (L) 101 - 111 mmol/L   CO2 18 (L) 22 - 32 mmol/L   Glucose, Bld 67 65 - 99 mg/dL   BUN 12 6 - 20 mg/dL   Creatinine, Ser 0.73 0.61 - 1.24 mg/dL   Calcium 9.0 8.9 - 10.3 mg/dL   Total Protein 8.0 6.5 - 8.1 g/dL   Albumin 4.2 3.5 - 5.0 g/dL   AST 93 (H) 15 - 41 U/L   ALT 40 17 - 63 U/L   Alkaline Phosphatase 69 38 - 126 U/L   Total Bilirubin 0.9 0.3 - 1.2 mg/dL   GFR calc non Af Amer >  60 >60 mL/min   GFR calc Af Amer >60 >60 mL/min    Comment: (NOTE) The eGFR has been calculated using the CKD EPI equation. This calculation has not been validated in all clinical situations. eGFR's persistently <60 mL/min signify possible Chronic Kidney Disease.    Anion gap 21 (H) 5 - 15    Comment: Performed at [facility], [place] [address], [place], [place] [address]  Ethanol     Status: Abnormal   Collection Time: 08/24/17  8:25 PM  Result Value Ref Range   Alcohol, Ethyl (B) 316 (HH) <10 mg/dL    Comment: CRITICAL RESULT CALLED TO, READ BACK BY AND VERIFIED WITH: CHRISCO C,RN 08/24/17 2124 WAYK (NOTE) Lowest detectable limit for serum alcohol is 10 mg/dL. For medical purposes only. Performed at [facility], [place] [address] [place], [place] [address]   cbc     Status: Abnormal   Collection Time: 08/24/17  8:25 PM  Result Value Ref Range   WBC 6.9 4.0 - 10.5 K/uL   RBC 3.96 (L) 4.22 - 5.81 MIL/uL   Hemoglobin 11.7 (L) 13.0 - 17.0 g/dL   HCT 36.2 (L) 39.0 - 52.0 %   MCV 91.4 78.0 - 100.0 fL   MCH 29.5 26.0 - 34.0 pg   MCHC 32.3 30.0 - 36.0 g/dL   RDW 12.8 11.5 - 15.5 %   Platelets 352 150 - 400 K/uL     Comment: Performed at [facility], [place] [address] [place], [place] [address]  Rapid urine drug screen (hospital performed)     Status: None   Collection Time: 08/24/17  9:09 PM  Result Value Ref Range   Opiates NONE DETECTED NONE DETECTED   Cocaine NONE DETECTED NONE DETECTED   Benzodiazepines NONE DETECTED NONE DETECTED   Amphetamines NONE DETECTED NONE DETECTED   Tetrahydrocannabinol NONE DETECTED NONE DETECTED   Barbiturates NONE DETECTED NONE DETECTED    Comment: (NOTE) DRUG SCREEN FOR MEDICAL PURPOSES ONLY.  IF CONFIRMATION IS NEEDED FOR ANY PURPOSE, NOTIFY LAB WITHIN 5 DAYS. LOWEST DETECTABLE LIMITS FOR URINE DRUG SCREEN Drug Class                     Cutoff (ng/mL) Amphetamine and metabolites    1000 Barbiturate and metabolites    200 Benzodiazepine                 308 Tricyclics and metabolites     300 Opiates and metabolites        300 Cocaine and metabolites        300 THC                            50 Performed at [facility], [place] [address], [place],  [address]     Medications:  Current Facility-Administered Medications  Medication Dose Route Frequency Provider Last Rate Last Dose  . LORazepam (ATIVAN) injection 0-4 mg  0-4 mg Intravenous Q6H Davonna Belling, MD   2 mg at 08/25/17 0352   Or  . LORazepam (ATIVAN) tablet 0-4 mg  0-4 mg Oral Q6H Davonna Belling, MD   2 mg at 08/25/17 0951  . [START ON 08/27/2017] LORazepam (ATIVAN) injection 0-4 mg  0-4 mg Intravenous Clayborne Artist, MD       Or  . Derrill Memo ON 08/27/2017] LORazepam (ATIVAN) tablet 0-4 mg  0-4 mg Oral Q12H Davonna Belling, MD      .  thiamine (VITAMIN B-1) tablet 100 mg  100 mg Oral Daily Davonna Belling, MD       Or  . thiamine (B-1) injection 100 mg  100 mg Intravenous Daily Davonna Belling, MD       Current Outpatient Medications  Medication Sig Dispense Refill  . gabapentin (NEURONTIN) 100 MG capsule Take 100 mg by mouth 3 (three) times daily.    . magnesium  30 MG tablet Take 1 tablet (30 mg total) by mouth 2 (two) times daily. 10 tablet 0  . Metoprolol Tartrate 37.5 MG TABS Take 37.5 mg by mouth daily.    . Multiple Vitamins-Minerals (THERA-M) TABS Take 1 tablet by mouth daily.    Marland Kitchen omeprazole (PRILOSEC) 20 MG capsule Take 20 mg by mouth 2 (two) times daily.    . potassium chloride SA (K-DUR,KLOR-CON) 20 MEQ tablet Take 20 mEq by mouth daily.  0  . thiamine 100 MG tablet Take 100 mg by mouth daily.    Marland Kitchen ULORIC 40 MG tablet Take 40 mg by mouth daily.  2  . Vitamin D, Ergocalciferol, (DRISDOL) 50000 units CAPS capsule Take by mouth once a week.    . chlordiazePOXIDE (LIBRIUM) 25 MG capsule 25m PO TID x 1D, then 25-547mPO BID X 1D, then 25-506mO QD X 1D 10 capsule 0    Musculoskeletal:  Unable to assess via camera  Psychiatric Specialty Exam: Physical Exam  Review of Systems  Psychiatric/Behavioral: Positive for depression and substance abuse. Negative for hallucinations, memory loss and suicidal ideas. The patient is nervous/anxious. The patient does not have insomnia.     Blood pressure (!) 164/103, pulse 98, temperature 98.4 F (36.9 C), temperature source Oral, resp. rate 18, SpO2 100 %.There is no height or weight on file to calculate BMI.  General Appearance: Disheveled  Eye Contact:  Good  Speech:  Clear and Coherent  Volume:  Normal  Mood:  Depressed  Affect:  Constricted  Thought Process:  Coherent and Goal Directed  Orientation:  Full (Time, Place, and Person)  Thought Content:  Symptoms of depression, inability to stop drinking alcohol  Suicidal Thoughts:  No  Homicidal Thoughts:  No  Memory:  Immediate;   Good Recent;   Good Remote;   Good  Judgement:  Fair  Insight:  Present  Psychomotor Activity:  Normal  Concentration:  Concentration: Good and Attention Span: Good  Recall:  Good  Fund of Knowledge:  Good  Language:  Good  Akathisia:  No  Handed:  Right  AIMS (if indicated):     Assets:  Communication  Skills Desire for Improvement Financial Resources/Insurance Leisure Time Resilience  ADL's:  Intact  Cognition:  WNL  Sleep:        Treatment Plan Summary: Plan Admit inpatient for treatment of depression and alcohol detox treatments  Disposition: Recommend psychiatric Inpatient admission when medically cleared. Supportive therapy provided about ongoing stressors.  This service was provided via telemedicine using a 2-way, interactive audio and video technology.  Names of all persons participating in this telemedicine service and their role in this encounter. Name: LauElmarie Shileyole: PMHNP-C  Name: EdwWynelle Clevelandole: Patient  Name:  Role:   Name:  Role:     DAVElmarie ShileyP 08/25/2017 11:39 AM

## 2017-08-25 NOTE — ED Notes (Signed)
Patient at desk to call grandmother-Monique,RN

## 2017-08-25 NOTE — ED Notes (Signed)
Informed provider of CIWA and pain

## 2017-08-25 NOTE — ED Triage Notes (Signed)
TTS done 

## 2017-08-25 NOTE — ED Triage Notes (Signed)
Reported to EDP Cook BP 171/107.  New orders given

## 2017-08-25 NOTE — ED Triage Notes (Signed)
[facility] New orders given

## 2017-08-25 NOTE — ED Notes (Signed)
Breakfast tray ordered 

## 2017-08-25 NOTE — ED Notes (Signed)
Pt changed into scrubs, all belongings in bags to be inventoried. Sandwich, drink and crackers given to pt. Pt denies SI/HI. Pt has visible tremors at this time. Will continue to monitor.

## 2017-08-25 NOTE — ED Provider Notes (Signed)
  Physical Exam  BP (!) 150/90   Pulse (!) 103   Temp 97.9 F (36.6 C) (Oral)   Resp 15   SpO2 99%   Physical Exam  ED Course/Procedures     Procedures  MDM  Patient states that he has been hallucinating more.  States that happens both when he is drinking and he has not.  Does not appear actively suicidal at this time.  Complaining of pain in his legs.  With the hallucinations will have patient see [facility].       Benjiman CorePickering, Melissia Lahman, MD 08/25/17 [phone]

## 2017-08-25 NOTE — ED Notes (Signed)
Regular diet dinner tray ordered. Reguested that sharps not be included on the tray.

## 2017-08-26 MED ORDER — HYDROCHLOROTHIAZIDE 25 MG PO TABS: 25.0000 mg | ORAL_TABLET | Freq: Every day | ORAL | 0 refills | Status: AC

## 2017-08-26 MED ORDER — METOPROLOL TARTRATE 50 MG PO TABS: 50.0000 mg | ORAL_TABLET | Freq: Every day | ORAL | 0 refills | Status: AC

## 2017-08-26 MED ORDER — IBUPROFEN 800 MG PO TABS
800.0000 mg | ORAL_TABLET | Freq: Four times a day (QID) | ORAL | Status: DC | PRN
  Administered 2017-08-26: 800 mg via ORAL
  Filled 2017-08-26: qty 1

## 2017-08-26 MED ORDER — CLONIDINE HCL 0.2 MG PO TABS
0.3000 mg | ORAL_TABLET | Freq: Once | ORAL | Status: AC
  Administered 2017-08-26: 0.3 mg via ORAL
  Filled 2017-08-26: qty 2

## 2017-08-26 MED ORDER — METOPROLOL TARTRATE 25 MG PO TABS
50.0000 mg | ORAL_TABLET | Freq: Every day | ORAL | Status: DC
  Administered 2017-08-26: 50 mg via ORAL
  Filled 2017-08-26: qty 2

## 2017-08-26 MED ORDER — CHLORDIAZEPOXIDE HCL 25 MG PO CAPS: ORAL_CAPSULE | ORAL | 0 refills | Status: AC

## 2017-08-26 MED ORDER — HYDROCHLOROTHIAZIDE 25 MG PO TABS
25.0000 mg | ORAL_TABLET | Freq: Every day | ORAL | Status: DC
  Administered 2017-08-26: 25 mg via ORAL
  Filled 2017-08-26: qty 1

## 2017-08-26 NOTE — ED Provider Notes (Signed)
  Physical Exam  BP (!) 194/121 (BP Location: Right Arm)   Pulse 86   Temp 98.7 F (37.1 C) (Oral)   Resp 18   SpO2 100%   Physical Exam  ED Course/Procedures     Procedures  MDM  Psych saw patient and patient doesn't meet inpatient psych admission criteria. Hypertensive in the ED and has leg swelling. Given HCTZ, increase his metoprolol to 50 mg daily. Patient can be discharged with follow up with [facility] center[facility]. Will start librium taper as well.      Charlynne PanderYao, Dimitrios Balestrieri Hsienta, MD 08/26/17 1106

## 2017-08-26 NOTE — ED Notes (Signed)
Pt sitting on side of bed eating breakfast. Pt noted w/no tremors while eating and when talking to RN until he began shaking his left hand - stating "I still have tremors and my head still hurts". Advised pt may have Tylenol or Ibuprofen - states he cannot take Tylenol but will take Ibuprofen "if it's a high enough milligram so I can rest". States he and his wife had a disagreement 4 days ago and he left his home meds at home. States last ETOH was "3-4 days ago".

## 2017-08-26 NOTE — ED Notes (Addendum)
Dr Silverio LayYao aware pt's BP remains elevated - 194/121. Pt voiced understanding and agreement w/tx plan - d/c to home - pt is to call [facility] himself - aware they do have male beds available. Pt spoke w/daughter on phone - advised she will come pick him up.

## 2017-08-26 NOTE — ED Notes (Signed)
Woke pt to inquire when had last seizure - states was 1-1/2 years ago and states has hx seizures w/[facility] detox.

## 2017-08-26 NOTE — ED Notes (Signed)
Pt's daughter arrived to pick up pt. RN escorted pt to vehicle. Pt advised he wanted to wear burgundy scrubs home and states will shower at home. Pt voiced understanding and agreement w/tx plan - call [facility] immediately as they have beds available.

## 2017-08-26 NOTE — Consult Note (Addendum)
  Tele Assessment   Stephen MuffEdward Derise, 47 y.o., male [facility] for [facility] dependence.  Patient seen via telepsych by [facility]TS and  this provider; chart reviewed and consulted with Dr. Lucianne MussKumar on 08/26/17.  On evaluation Stephen Summers [place] that [place] [facility] [place] I just came down here to visit my kids.  My daughter brought me here."  Patient states that he has a long history of alcohol abuse and has never been to rehab; but knows he need to stop.  Patient denies suicidal/self-harm/homicidal ideation, psychosis, and paranoia.   During evaluation Stephen Summers [place] is alert/oriented x 4; calm/cooperative with pleasant affect.  He does not appear to be responding to internal/external stimuli or delusional thoughts.  Patient denies suicidal/self-harm/homicidal ideation, psychosis, and paranoia.  Patient answered question appropriately.  Patient psychiatrically cleared    Recommendations: Patient psychiatrically cleared.  Resources for alcohol use disorders.  Disposition: No evidence of imminent risk to self or others at present.   Patient does not meet criteria for psychiatric inpatient admission..  Patient has been referred to [facility]; will send patient information to contact to see if he has been accepted  Spoke with Dr. Thurnell LoseYow related to the above recommendations and disposition  Assunta FoundShuvon Rankin, NP

## 2017-08-26 NOTE — ED Notes (Addendum)
Offered for pt to shower - states wants to wait until his daughter brings him clean underwear - advised pt no personal items - voiced understanding. States he wants to do ETOH detox outpt. States does not want inpt.

## 2017-08-26 NOTE — [facility] Counselor (Signed)
Re-assessment:   Patient report he came to the hospital for medical complications of head hurting, arm / chest pain, eye discomfort and swollen leg / feet. Patient denies suicidal / homicidal ideation, auditory / visual hallucinations. Denies feeling of paranoia.   Patient alcohol level on admission 316. Report came to the hospital due to his drinking.   Stephen Rankin, Stephen Summers, present during assessment. Patient continues to deny SI, HI and AVH. Stephen spoke with patient concerning inpatient rehabilitation and/or outpatient therapy services for substance abuse (drinking).    Disposition:  Stephen Rankin, Stephen Summers, recommend discharge with resources.

## 2017-08-29 ENCOUNTER — Other Ambulatory Visit: Payer: Self-pay

## 2017-08-29 ENCOUNTER — Emergency Department (HOSPITAL_COMMUNITY)
Admission: EM | Admit: 2017-08-29 | Discharge: 2017-08-29 | Disposition: A | Discharge status: Home / Self Care | Payer: Medicaid Other | Attending: Emergency Medicine | Admitting: Emergency Medicine

## 2017-08-29 ENCOUNTER — Encounter (HOSPITAL_COMMUNITY): Payer: Self-pay | Admitting: Emergency Medicine

## 2017-08-29 DIAGNOSIS — I10 Essential (primary) hypertension: Secondary | ICD-10-CM | POA: Insufficient documentation

## 2017-08-29 DIAGNOSIS — F10229 Alcohol dependence with intoxication, unspecified: Secondary | ICD-10-CM | POA: Insufficient documentation

## 2017-08-29 DIAGNOSIS — F17210 Nicotine dependence, cigarettes, uncomplicated: Secondary | ICD-10-CM | POA: Insufficient documentation

## 2017-08-29 DIAGNOSIS — F10220 Alcohol dependence with intoxication, uncomplicated: Secondary | ICD-10-CM

## 2017-08-29 DIAGNOSIS — Z79899 Other long term (current) drug therapy: Secondary | ICD-10-CM | POA: Insufficient documentation

## 2017-08-29 HISTORY — DX: Alcohol abuse, uncomplicated: F10.10

## 2017-08-29 LAB — COMPREHENSIVE METABOLIC PANEL
ALBUMIN: 4.5 g/dL (ref 3.5–5.0)
ALT: 95 U/L — AB (ref 17–63)
ANION GAP: 12 (ref 5–15)
AST: 189 U/L — ABNORMAL HIGH (ref 15–41)
Alkaline Phosphatase: 75 U/L (ref 38–126)
BUN: 8 mg/dL (ref 6–20)
CO2: 28 mmol/L (ref 22–32)
CREATININE: 0.69 mg/dL (ref 0.61–1.24)
Calcium: 9 mg/dL (ref 8.9–10.3)
Chloride: 101 mmol/L (ref 101–111)
GFR calc non Af Amer: >60 mL/min (ref >60)
GLUCOSE: 111 mg/dL — AB (ref 65–99)
Potassium: 3.3 mmol/L — ABNORMAL LOW (ref 3.5–5.1)
SODIUM: 141 mmol/L (ref 135–145)
Total Bilirubin: 0.6 mg/dL (ref 0.3–1.2)
Total Protein: 7.7 g/dL (ref 6.5–8.1)

## 2017-08-29 LAB — RAPID URINE DRUG SCREEN, HOSP PERFORMED
AMPHETAMINES: NOT DETECTED
BENZODIAZEPINES: POSITIVE — AB
Cocaine: NOT DETECTED
Opiates: NOT DETECTED
Tetrahydrocannabinol: NOT DETECTED

## 2017-08-29 LAB — CBC
HEMATOCRIT: 37.2 % — AB (ref 39.0–52.0)
Hemoglobin: 12.3 g/dL — ABNORMAL LOW (ref 13.0–17.0)
MCH: 29.6 pg (ref 26.0–34.0)
MCHC: 33.1 g/dL (ref 30.0–36.0)
MCV: 89.6 fL (ref 78.0–100.0)
Platelets: 267 10*3/uL (ref 150–400)
RBC: 4.15 MIL/uL — ABNORMAL LOW (ref 4.22–5.81)
RDW: 13.7 % (ref 11.5–15.5)
WBC: 5.1 10*3/uL (ref 4.0–10.5)

## 2017-08-29 LAB — ETHANOL: Alcohol, Ethyl (B): 413 mg/dL (ref <10)

## 2017-08-29 MED ORDER — IBUPROFEN 800 MG PO TABS
800.0000 mg | ORAL_TABLET | Freq: Once | ORAL | Status: AC
  Administered 2017-08-29: 800 mg via ORAL
  Filled 2017-08-29: qty 1

## 2017-08-29 NOTE — ED Notes (Signed)
Ambulated pt around nurses station.  Pt didn't want to at first and stated he was afraid he would fall.  Pt had a slow gait with slight limp.

## 2017-08-29 NOTE — ED Provider Notes (Signed)
[facility] EMERGENCY DEPARTMENT Provider Note   CSN: [id number] Arrival date & time: 08/29/17  0623     History   Chief Complaint Chief Complaint  Patient presents with  . Alcohol Intoxication    HPI Stephen Summers is a 47 y.o. male.  HPI   48 year old male with significant history of alcohol abuse, alcohol-related seizure, hypertension, gout, depression brought here dropped off by daughter for further management of alcohol abuse.  Patient admits that he has been abusing alcohol by drinking large amount of alcohol for the past several weeks.  He felt this stem from recent breaking up with his significant other.  He admits to being depressed without SI or HI.  Today he drank several beers as well as a pint of liquor.  He is currently complaining of headache, body aches, abdominal pain, leg pain and not feeling well.  He admits that he is self-medicating and also admits that he is having a very difficult time quitting drinking.  He is requesting for help.  Past Medical History:  Diagnosis Date  . Alcohol abuse   . Gout   . Hypertension   . Seizures [facility])     Patient Active Problem List   Diagnosis Date Noted  . MDD (major depressive disorder), recurrent episode, severe ([place]) 08/25/2017  . Alcohol dependence ([place]) 08/25/2017  . Alcoholic intoxication without complication [facility])     Past Surgical History:  Procedure Laterality Date  . HERNIA REPAIR          Home Medications    Prior to Admission medications   Medication Sig Start Date End Date Taking? Authorizing Provider  chlordiazePOXIDE (LIBRIUM) 25 MG capsule '50mg'$  PO TID x 1D, then 25-'50mg'$  PO BID X 1D, then 25-'50mg'$  PO QD X 1D 08/26/17   Drenda Freeze, MD  gabapentin (NEURONTIN) 100 MG capsule Take 100 mg by mouth 3 (three) times daily. 04/16/17   [provider]  hydrochlorothiazide (HYDRODIURIL) 25 MG tablet Take 1 tablet (25 mg total) by mouth daily. 08/26/17   Drenda Freeze, MD    magnesium 30 MG tablet Take 1 tablet (30 mg total) by mouth 2 (two) times daily. 04/24/17   Kirichenko, Tatyana, PA-C  metoprolol tartrate (LOPRESSOR) 50 MG tablet Take 1 tablet (50 mg total) by mouth daily. 08/26/17   Drenda Freeze, MD  Multiple Vitamins-Minerals (THERA-M) TABS Take 1 tablet by mouth daily. 06/14/17   [provider]  omeprazole (PRILOSEC) 20 MG capsule Take 20 mg by mouth 2 (two) times daily.    [provider]  potassium chloride SA (K-DUR,KLOR-CON) 20 MEQ tablet Take 20 mEq by mouth daily. 03/29/17   [provider]  thiamine 100 MG tablet Take 100 mg by mouth daily. 06/14/17 06/14/18  [provider]  ULORIC 40 MG tablet Take 40 mg by mouth daily. 04/14/17   [provider]  Vitamin D, Ergocalciferol, (DRISDOL) 50000 units CAPS capsule Take by mouth once a week. 12/30/16   [provider]    Family History No family history on file.  Social History Social History   Tobacco Use  . Smoking status: Current Every Day Smoker  . Smokeless tobacco: Never Used  Substance Use Topics  . Alcohol use: Yes    Comment: states he drinks everyday, currently too intoxicated to specify further  . Drug use: No     Allergies   Acetaminophen   Review of Systems Review of Systems  All other systems reviewed and are  negative.    Physical Exam Updated Vital Signs BP (!) 139/99 (BP Location: Left Arm)   Pulse 86   Temp (!) 97.5 F (36.4 C) (Oral)   Resp 18   SpO2 100%   Physical Exam  Constitutional: He appears well-developed and well-nourished. No distress.  HENT:  Head: Atraumatic.  Mouth/Throat: Oropharynx is clear and moist.  Eyes: Conjunctivae are normal.  Neck: Neck supple.  Cardiovascular: Normal rate and regular rhythm.  Pulmonary/Chest: Effort normal and breath sounds normal.  Abdominal: Soft. There is tenderness (Diffuse abdominal tenderness without guarding or rebound tenderness.).  Musculoskeletal:  Normal range of motion.  Neurological: He is alert.  Alert to self, place, and time.  Appears mildly intoxicated.  Skin: No rash noted.  Psychiatric: He has a normal mood and affect.  Nursing note and vitals reviewed.    ED Treatments / Results  Labs (all labs ordered are listed, but only abnormal results are displayed) Labs Reviewed  COMPREHENSIVE METABOLIC PANEL - Abnormal; Notable for the following components:      Result Value   Potassium 3.3 (*)    Glucose, Bld 111 (*)    AST 189 (*)    ALT 95 (*)    All other components within normal limits  ETHANOL - Abnormal; Notable for the following components:   Alcohol, Ethyl (B) 413 (*)    All other components within normal limits  CBC - Abnormal; Notable for the following components:   RBC 4.15 (*)    Hemoglobin 12.3 (*)    HCT 37.2 (*)    All other components within normal limits  RAPID URINE DRUG SCREEN, HOSP PERFORMED    EKG None  Radiology No results found.  Procedures Procedures (including critical care time)  Medications Ordered in ED Medications  ibuprofen (ADVIL,MOTRIN) tablet 800 mg (800 mg Oral Given 08/29/17 1459)     Initial Impression / Assessment and Plan / ED Course  I have reviewed the triage vital signs and the nursing notes.  Pertinent labs & imaging results that were available during my care of the patient were reviewed by me and considered in my medical decision making (see chart for details).     BP (!) 139/99 (BP Location: Left Arm)   Pulse 86   Temp (!) 97.5 F (36.4 C) (Oral)   Resp 18   SpO2 100%    Final Clinical Impressions(s) / ED Diagnoses   Final diagnoses:  Alcohol dependence with uncomplicated intoxication [facility])    ED Discharge Orders    None     1:35 PM Patient with significant history of alcohol abuse here requesting for help with detox.  He does appears to be mildly intoxicated but able to give history.  He endorsed pain throughout his body which appears to be  chronic in nature.  His alcohol level is 413.  Evidence of transaminitis with AST 189, ALT 95, alk phos 75.  3:48 PM Pt will need to be monitor until clinically sober, then can be discharge home with resources.  He will benefit from outpt residential treatment for his alcohol abuse. Pt signed out to oncoming provider.    Domenic Moras, PA-C 08/29/17 1549    Charlesetta Shanks, MD 08/29/17 1719

## 2017-08-29 NOTE — Discharge Instructions (Addendum)
Please use resources below to find a residential substance abuse clinic willing to help you with alcohol dependency.

## 2017-08-29 NOTE — ED Notes (Signed)
Pt verbalized understanding of discharge instructions.

## 2017-08-29 NOTE — ED Triage Notes (Signed)
Patient dropped off by daughter - pt states he is here for alcohol problem. Was recently here 5 days ago for same, but did not meet inpatient treatment and has continued to drink heavily since. Patient currently intoxicated and unable to specify when last drink was, just states "today." Patient A&O x 4. Denies SI/HI.

## 2019-06-17 IMAGING — CR DG CHEST 2V
2 series · 2 of 2 positions shown · non-contrast
Comparison: None

CLINICAL DATA: Numbness in both legs from toes knees, headache,
sharp chest pain

EXAM:
CHEST  2 VIEW

[chest pa]
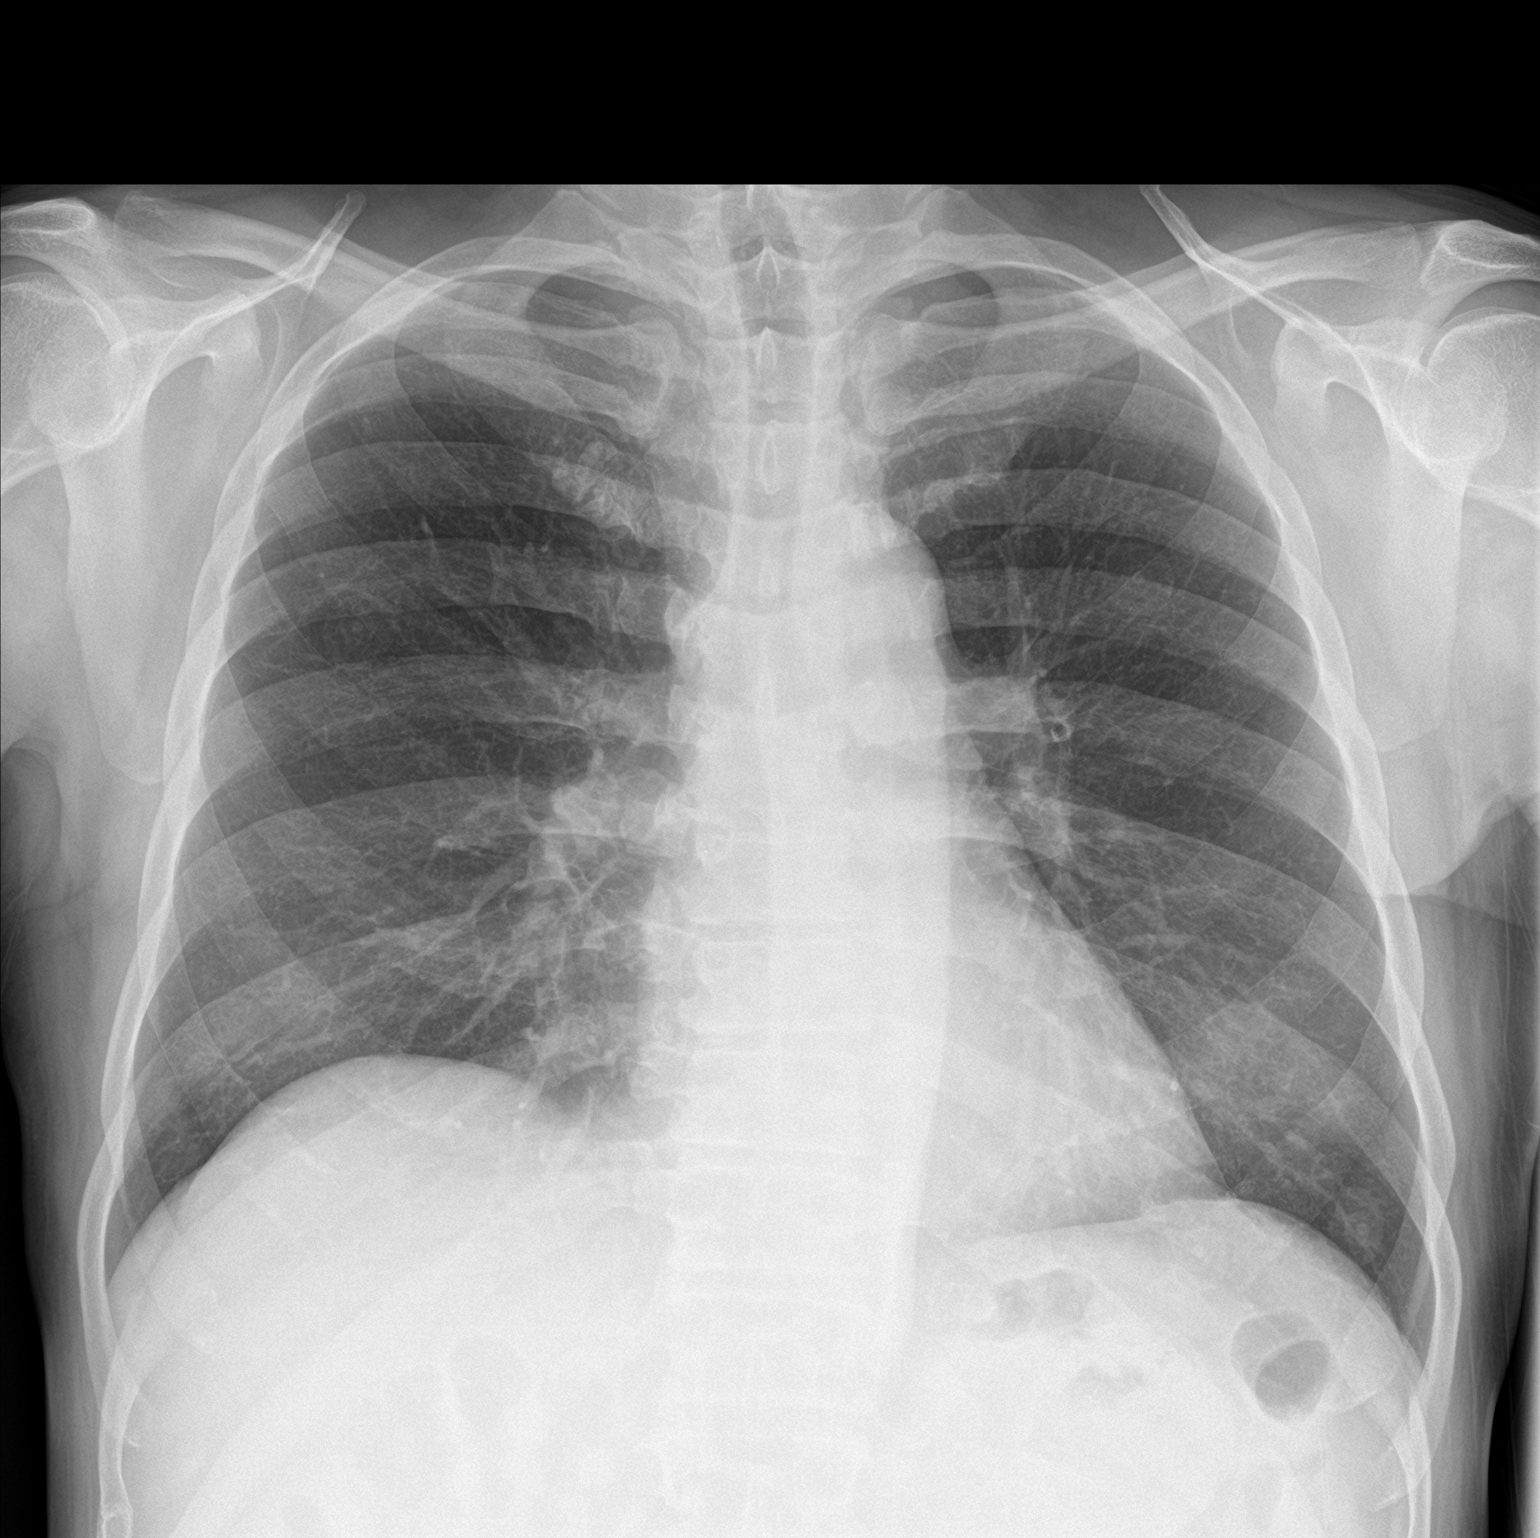

[chest lat]
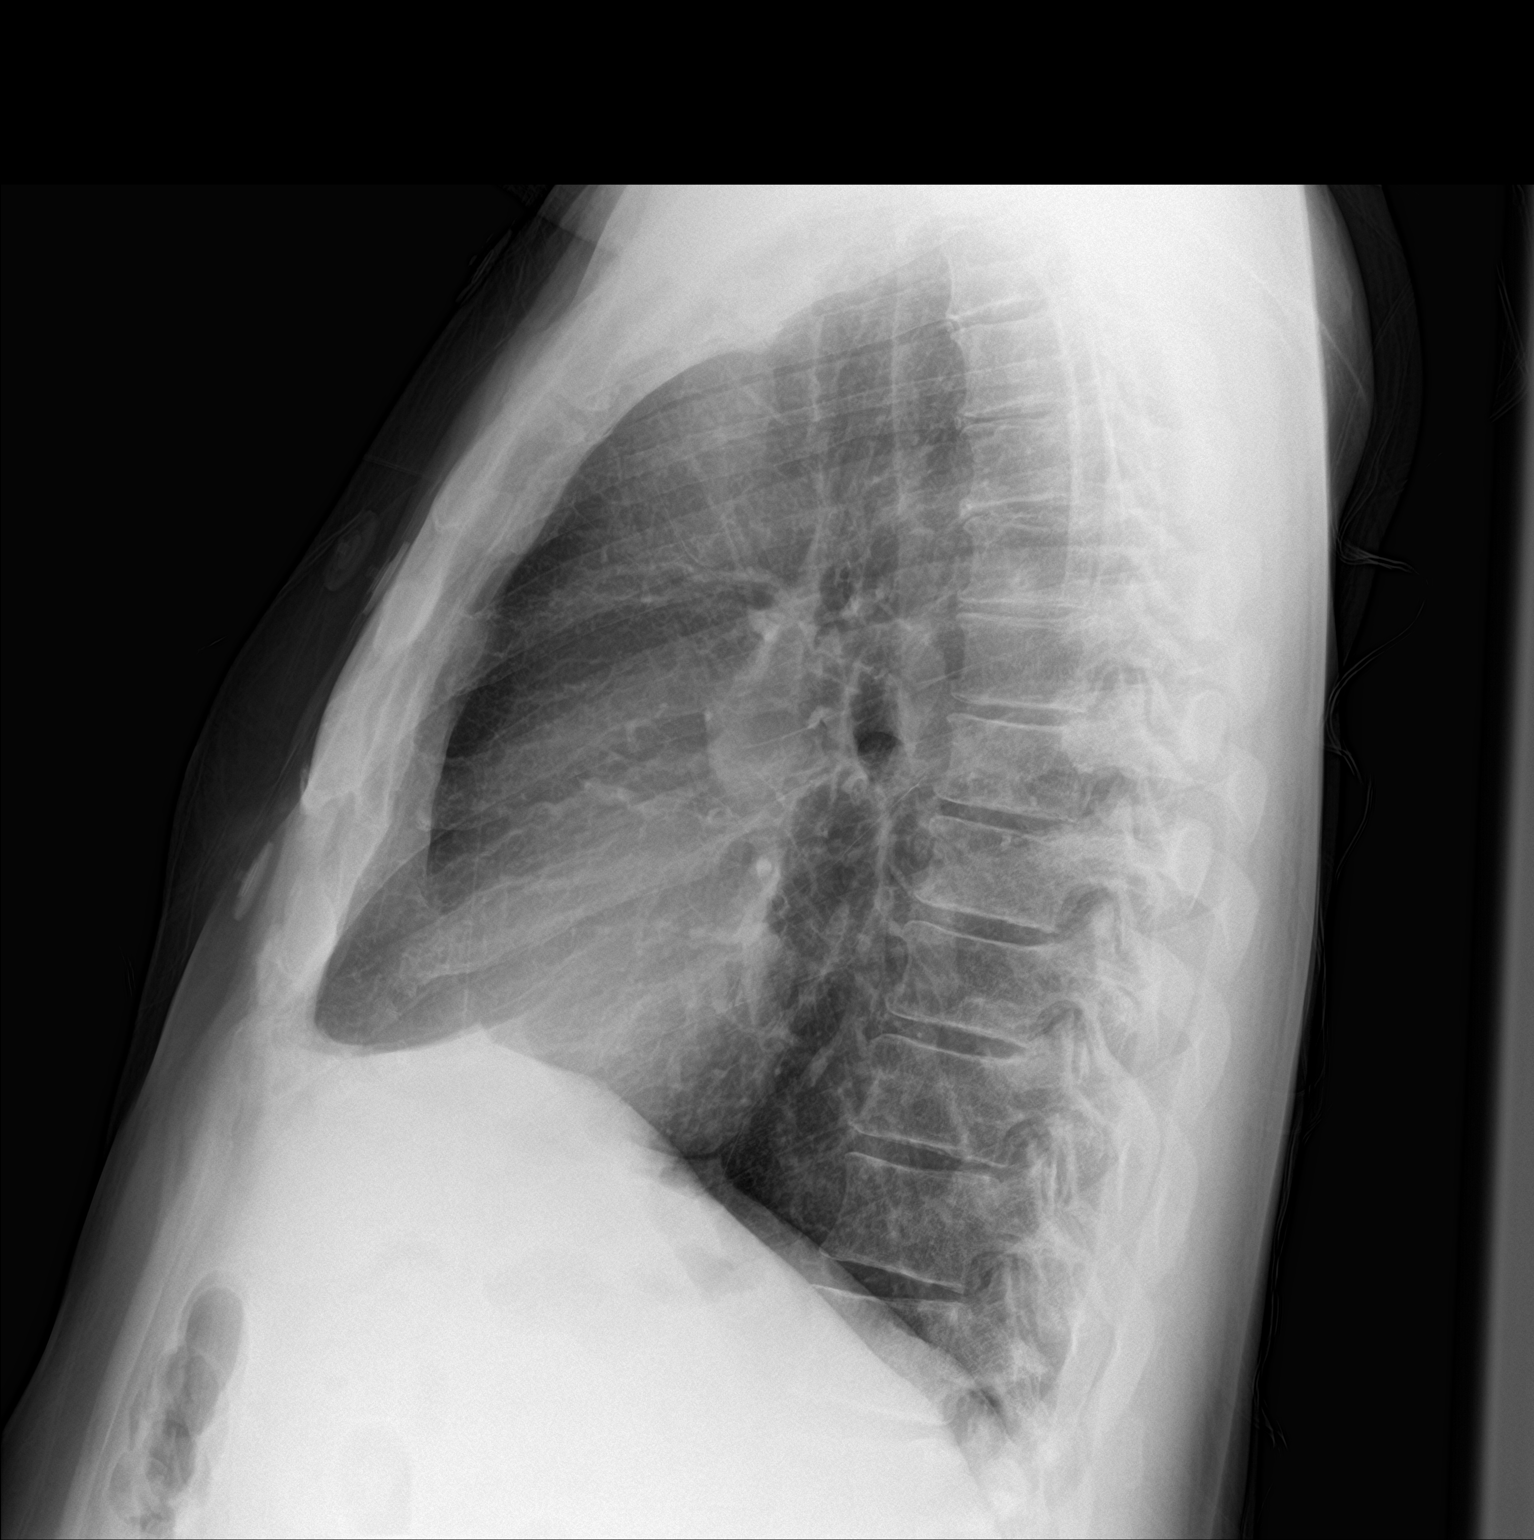

[2 of 2 positions shown; findings below may reference images not displayed]

FINDINGS: Normal heart size, mediastinal contours, and pulmonary vascularity.

Mild peribronchial thickening.

No pulmonary infiltrate, pleural effusion, or pneumothorax.

Bones unremarkable.
IMPRESSION: Mild bronchitic changes without infiltrate.
